# Patient Record
Sex: Female | Born: 1935 | Race: White | Hispanic: No | Smoking: Former smoker
Health system: Southern US, Community
[De-identification: ages and names within clinical notes are randomized; demographics above are authoritative.]

## PROBLEM LIST (undated history)

## (undated) DIAGNOSIS — I351 Nonrheumatic aortic (valve) insufficiency: Secondary | ICD-10-CM

## (undated) DIAGNOSIS — I509 Heart failure, unspecified: Secondary | ICD-10-CM

## (undated) DIAGNOSIS — I10 Essential (primary) hypertension: Secondary | ICD-10-CM

## (undated) DIAGNOSIS — I2510 Atherosclerotic heart disease of native coronary artery without angina pectoris: Secondary | ICD-10-CM

## (undated) DIAGNOSIS — J449 Chronic obstructive pulmonary disease, unspecified: Secondary | ICD-10-CM

## (undated) DIAGNOSIS — Z9889 Other specified postprocedural states: Secondary | ICD-10-CM

## (undated) DIAGNOSIS — E785 Hyperlipidemia, unspecified: Secondary | ICD-10-CM

## (undated) DIAGNOSIS — I340 Nonrheumatic mitral (valve) insufficiency: Secondary | ICD-10-CM

## (undated) HISTORY — PX: APPENDECTOMY: SHX54

## (undated) HISTORY — DX: Nonrheumatic aortic (valve) insufficiency: I35.1

## (undated) HISTORY — PX: TONSILLECTOMY: SUR1361

## (undated) HISTORY — DX: Chronic obstructive pulmonary disease, unspecified: J44.9

## (undated) HISTORY — DX: Nonrheumatic mitral (valve) insufficiency: I34.0

## (undated) HISTORY — DX: Heart failure, unspecified: I50.9

## (undated) HISTORY — DX: Essential (primary) hypertension: I10

## (undated) HISTORY — DX: Atherosclerotic heart disease of native coronary artery without angina pectoris: I25.10

## (undated) HISTORY — DX: Other specified postprocedural states: Z98.890

## (undated) HISTORY — DX: Hyperlipidemia, unspecified: E78.5

---

## 2003-04-13 DIAGNOSIS — I2510 Atherosclerotic heart disease of native coronary artery without angina pectoris: Secondary | ICD-10-CM

## 2003-04-13 HISTORY — DX: Atherosclerotic heart disease of native coronary artery without angina pectoris: I25.10

## 2003-04-13 HISTORY — PX: CORONARY ARTERY BYPASS GRAFT: SHX141

## 2003-09-20 ENCOUNTER — Other Ambulatory Visit: Payer: Self-pay

## 2005-10-19 ENCOUNTER — Ambulatory Visit: Payer: Self-pay | Admitting: Family Medicine

## 2005-10-21 ENCOUNTER — Ambulatory Visit: Payer: Self-pay | Admitting: General Surgery

## 2005-10-21 ENCOUNTER — Other Ambulatory Visit: Payer: Self-pay

## 2005-12-22 ENCOUNTER — Ambulatory Visit: Payer: Self-pay | Admitting: General Surgery

## 2005-12-29 ENCOUNTER — Ambulatory Visit: Payer: Self-pay | Admitting: General Surgery

## 2006-12-28 ENCOUNTER — Ambulatory Visit: Payer: Self-pay | Admitting: Family Medicine

## 2007-07-04 ENCOUNTER — Encounter: Payer: Self-pay | Admitting: Cardiovascular Disease

## 2008-02-29 ENCOUNTER — Encounter: Payer: Self-pay | Admitting: Cardiovascular Disease

## 2008-06-19 ENCOUNTER — Emergency Department: Payer: Self-pay | Admitting: Emergency Medicine

## 2009-03-17 ENCOUNTER — Emergency Department: Payer: Self-pay | Admitting: Emergency Medicine

## 2009-09-09 ENCOUNTER — Encounter: Payer: Self-pay | Admitting: Cardiovascular Disease

## 2009-09-11 ENCOUNTER — Ambulatory Visit: Payer: Self-pay | Admitting: Family Medicine

## 2009-09-15 ENCOUNTER — Encounter: Payer: Self-pay | Admitting: Cardiovascular Disease

## 2009-09-23 ENCOUNTER — Encounter: Payer: Self-pay | Admitting: Cardiovascular Disease

## 2010-04-12 HISTORY — PX: CAROTID ENDARTERECTOMY: SUR193

## 2010-04-27 ENCOUNTER — Inpatient Hospital Stay: Payer: Medicare Other | Admitting: Internal Medicine

## 2010-04-27 ENCOUNTER — Encounter: Payer: Self-pay | Admitting: Cardiovascular Disease

## 2010-04-28 ENCOUNTER — Encounter: Payer: Self-pay | Admitting: Cardiovascular Disease

## 2010-05-04 ENCOUNTER — Encounter: Payer: Self-pay | Admitting: Cardiovascular Disease

## 2010-05-21 ENCOUNTER — Encounter: Payer: Self-pay | Admitting: Cardiovascular Disease

## 2010-05-28 ENCOUNTER — Encounter: Payer: Self-pay | Admitting: Cardiovascular Disease

## 2010-05-28 ENCOUNTER — Ambulatory Visit (INDEPENDENT_AMBULATORY_CARE_PROVIDER_SITE_OTHER): Payer: Medicare Other | Admitting: Cardiovascular Disease

## 2010-05-28 DIAGNOSIS — E785 Hyperlipidemia, unspecified: Secondary | ICD-10-CM | POA: Insufficient documentation

## 2010-05-28 DIAGNOSIS — I2510 Atherosclerotic heart disease of native coronary artery without angina pectoris: Secondary | ICD-10-CM

## 2010-05-28 DIAGNOSIS — R0989 Other specified symptoms and signs involving the circulatory and respiratory systems: Secondary | ICD-10-CM | POA: Insufficient documentation

## 2010-05-28 DIAGNOSIS — I25810 Atherosclerosis of coronary artery bypass graft(s) without angina pectoris: Secondary | ICD-10-CM | POA: Insufficient documentation

## 2010-05-28 DIAGNOSIS — I519 Heart disease, unspecified: Secondary | ICD-10-CM | POA: Insufficient documentation

## 2010-05-28 DIAGNOSIS — I739 Peripheral vascular disease, unspecified: Secondary | ICD-10-CM

## 2010-05-28 DIAGNOSIS — I10 Essential (primary) hypertension: Secondary | ICD-10-CM

## 2010-06-03 NOTE — Assessment & Plan Note (Signed)
Summary: History of CABG and CHF/AMD   Visit Type:  Initial Consult Primary Provider:  Dr.Hedrick  CC:  Dr. Burnett Sheng referred. c/o SOB. denies chest palpitations. pt was in hospital for HTN and SOB. Marland Kitchen  History of Present Illness: [name] is a very pleasant 77 year old woman with a history of coronary artery disease, bypass surgery in 2005, moderate aortic valve regurgitation, mild to moderate MR, hyperlipidemia, hypertension, long smoking history for at least 40 years who presents for new patient evaluation.  She was recently admitted to the hospital for COPD exacerbation, felt also to have mild diastolic CHF with elevated BNP of 1600. She was treated with antibiotics and low-dose diuretic.  She states that she feels much better now with no significant shortness of breath. She is taking Advair on a regular basis. She stopped smoking in 2005. She denies any significant lower extremity edema, no cough. Her initial symptoms of COPD exacerbation started with a thick productive cough.  she is currently active, is able to exert herself to some degree with no limitations, balance is good  Last stress test was June 2011 showing no ischemia.  Cholesterol from 2009 shows total cholesterol 233, LDL 152  Echocardiogram June 2011 and shows moderate aortic valve insufficiency, mild to moderate MR with mitral valve prolapse, ejection fraction 55%  echo from January 2012 shows mild to moderate aortic valve insufficiency, mild MR, moderate MAC, moderately dilated left atrium  EKG shows normal sinus rhythm with a rate of 85 beats per minute, no significant ST or T wave changes  Preventive Screening-Counseling & Management  Alcohol-Tobacco     Smoking Status: never  Caffeine-Diet-Exercise     Does Patient Exercise: no      Drug Use:  no.    Problems Prior to Update: 1)  Carotid Bruits, Bilateral  (ICD-785.9)  Medications Prior to Update: 1)  None  Current Medications (verified): 1)  Paxil  20 Mg Tabs (Paroxetine Hcl) .Marland Kitchen.. 1 Tablet Once Daily 2)  Carvedilol 6.25 Mg Tabs (Carvedilol) .Marland Kitchen.. 1 Tablet Two Times A Day 3)  Aspir-Low 81 Mg Tbec (Aspirin) .Marland Kitchen.. 1 Tablet Once Daily 4)  Calcarb 600/d 600-400 Mg-Unit Tabs (Calcium Carbonate-Vitamin D) .Marland Kitchen.. 1 Tablet Once Daily 5)  Fish Oil 1000 Mg Caps (Omega-3 Fatty Acids) .Marland Kitchen.. 1 Tablet Once Daily 6)  Zantac 150 Mg Tabs (Ranitidine Hcl) .Marland Kitchen.. 1 Tablet Two Times A Day 7)  Alpresoline 10mg  .... 1 Tablet Four Times A Day 8)  Amitriptyline Hcl 25 Mg Tabs (Amitriptyline Hcl) .Marland Kitchen.. 1 Tablet At Bedtime 9)  Furosemide 20 Mg Tabs (Furosemide) .Marland Kitchen.. 1 Tablet Once Daily 10)  Advair Diskus 250-50 Mcg/dose Aepb (Fluticasone-Salmeterol) .Marland Kitchen.. 1 Puff Two Times A Day  Allergies (verified): 1)  ! Novocain 2)  ! * Tryline 3)  ! Codeine 4)  ! * Statins  Past History:  Family History: Last updated: 06-09-2010 Father: Emphysema-deceased Mother: unknown-deceased  Social History: Last updated: 2010/06/09 Retired  Single  Regular Exercise - no Drug Use - no Tobacco Use - No.  Alcohol Use - No   Risk Factors: Exercise: no (2010-06-09)  Risk Factors: Smoking Status: never (Jun 09, 2010)  Past Medical History: C O P D Hypertension Heart failure  Past Surgical History: CABG- [facility] Tonsillectomy Appendectomy  Family History: Father: Emphysema-deceased Mother: unknown-deceased  Social History: Retired  Single  Regular Exercise - no Drug Use - no Tobacco Use - No.  Alcohol Use - No  Does Patient Exercise:  no Drug Use:  no Smoking Status:  never  Review of Systems       The patient complains of dyspnea on exertion.  The patient denies fever, weight loss, weight gain, vision loss, decreased hearing, hoarseness, chest pain, syncope, peripheral edema, prolonged cough, abdominal pain, incontinence, muscle weakness, depression, and enlarged lymph nodes.    Vital Signs:  Patient profile:   77 year old female Height:      58.5  inches Weight:      82.12 pounds BMI:     16.93 Pulse rate:   85 / minute BP sitting:   136 / 81  (left arm) Cuff size:   regular  Vitals Entered By: Lysbeth Galas CMA (May 28, 2010 4:12 PM)   Physical Exam  General:  Well developed, well nourished, in no acute distress. Head:  normocephalic and atraumatic Neck:  Neck supple, no JVD. No masses, thyromegaly or abnormal cervical nodes. Lungs:  decreased breath sounds throughout bilaterally moderately Heart:  Non-displaced PMI, chest non-tender; regular rate and rhythm, S1, S2 with II/VI  diastolic murmur RSB, no rubs or gallops. Carotid upstroke normal, 2+ bruit on left, 1+ on right. Normal abdominal aortic size, no bruits.  Pedals normal pulses. No edema, no varicosities. Abdomen:  Bowel sounds positive; abdomen soft and non-tender without masses Msk:  Back normal, normal gait. Muscle strength and tone normal. Pulses:  pulses normal in all 4 extremities Extremities:  No clubbing or cyanosis. Neurologic:  Alert and oriented x 3. Skin:  Intact without lesions or rashes. Psych:  Normal affect.   Impression & Recommendations:  Problem # 1:  CAROTID BRUITS, BILATERAL (ICD-785.9) audible bruits bilaterally him a worse on the left. she reports that it has been years since her last carotid ultrasound. We have ordered a ultrasound to be done at her convenience.  Orders: Carotid Duplex (Carotid Duplex)  Problem # 2:  CAD, ARTERY BYPASS GRAFT (ICD-414.04) History of bypass in 2005. No intervention since that time. She did have a stress test June 2011 that showed no ischemia.  Her updated medication list for this problem includes:    Carvedilol 6.25 Mg Tabs (Carvedilol) .Marland Kitchen... 1 tablet two times a day    Aspir-low 81 Mg Tbec (Aspirin) .Marland Kitchen... 1 tablet once daily    Lisinopril 10 Mg Tabs (Lisinopril) .Marland Kitchen... Take one tablet by mouth daily  Problem # 3:  HYPERLIPIDEMIA-MIXED (ICD-272.4) she has hyperlipidemia and is not on a statin. She  does report myalgias with simvastatin. We will try her on Crestor 5 mg daily. Goal LDL is less than 70.  Her updated medication list for this problem includes:    Crestor 5 Mg Tabs (Rosuvastatin calcium) .Marland Kitchen... Take one tablet by mouth daily.  Problem # 4:  HYPERTENSION, BENIGN (ICD-401.1) Blood pressure is elevated. She brought in her blood pressure cuff with her today that does show systolic pressures in the 140-170 range. She has not picked up her higher dose hydralazine. We will add lisinopril 10 mg daily. We've asked her to continue to monitor her blood pressure  Her updated medication list for this problem includes:    Carvedilol 6.25 Mg Tabs (Carvedilol) .Marland Kitchen... 1 tablet two times a day    Aspir-low 81 Mg Tbec (Aspirin) .Marland Kitchen... 1 tablet once daily    Furosemide 20 Mg Tabs (Furosemide) .Marland Kitchen... 1 tablet once daily    Hydralazine Hcl 10 Mg Tabs (Hydralazine hcl) .Marland Kitchen... Take one tablet by mouth three times a day    Lisinopril 10 Mg Tabs (Lisinopril) .Marland Kitchen... Take one tablet by  mouth daily  Problem # 5:  DIASTOLIC DYSFUNCTION (ICD-429.9) She has elevated BNP, shortness of breath while in the hospital in January. We will continue the Lasix, start potassium 20 mg once daily as her potassium is low at 3.4 on recent blood work. She likely has some component of pulmonary hypertension causing her shortness of breath given her COPD. Given this, she will benefit from Lasix.  Other Orders: EKG w/ Interpretation (93000)  Patient Instructions: 1)  Your physician recommends that you schedule a follow-up appointment in: 3 months 2)  TO BE SCHEDULED IN 3 MONTHS:  Your physician recommends that you return for a FASTING lipid profile: (LIPID/LFT) 3)  Your physician has recommended you make the following change in your medication: START Lisinopril 10mg  once daily. START Crestor 5mg  once daily. START Potassium once daily. 4)  Your physician has requested that you have a carotid duplex. This test is an  ultrasound of the carotid arteries in your neck. It looks at blood flow through these arteries that supply the brain with blood. Allow one hour for this exam. There are no restrictions or special instructions. Prescriptions: CRESTOR 5 MG TABS (ROSUVASTATIN CALCIUM) Take one tablet by mouth daily.  #30 x 3   Entered by:   Lanny Hurst RN   Authorized by:   Dossie Arbour MD   Signed by:   Lanny Hurst RN on 05/28/2010   Method used:   Electronically to        [facility] [phone]* (retail)       [address] [place], [place]  [address]       Ph: [phone]       Fax: [phone]   RxID:   [id number] LISINOPRIL 10 MG TABS (LISINOPRIL) Take one tablet by mouth daily  #30 x 6   Entered by:   Lanny Hurst RN   Authorized by:   Dossie Arbour MD   Signed by:   Lanny Hurst RN on 05/28/2010   Method used:   Electronically to        [facility] [phone]* (retail)       [address] [place], [place]  [address]       Ph: [phone]       Fax: [phone]   RxID:   [id number] POTASSIUM CHLORIDE CRYS CR 20 MEQ CR-TABS (POTASSIUM CHLORIDE CRYS CR) Take one tablet by mouth daily  #30 x 6   Entered by:   Lanny Hurst RN   Authorized by:   Dossie Arbour MD   Signed by:   Lanny Hurst RN on 05/28/2010   Method used:   Electronically to        [facility] [phone]* (retail)       [address] [place], [place]  [address]       Ph: [phone]       Fax: [phone]   RxID:   [id number]

## 2010-06-09 NOTE — Letter (Signed)
Summary: Medical Record Release  Medical Record Release   Imported By: Harlon Flor 06/01/2010 13:20:32  _____________________________________________________________________  External Attachment:    Type:   Image     Comment:   External Document

## 2010-06-23 NOTE — Letter (Signed)
Summary: [facility] Recheck   Imported By: Roderic Ovens 06/18/2010 16:23:26  _____________________________________________________________________  External Attachment:    Type:   Image     Comment:   External Document

## 2010-06-23 NOTE — Letter (Signed)
Summary: [facility] Procedure  [facility]iology Procedure   Imported By: Roderic Ovens 06/18/2010 16:21:43  _____________________________________________________________________  External Attachment:    Type:   Image     Comment:   External Document

## 2010-06-23 NOTE — Letter (Signed)
Summary: [facility] Recheck  [facility] Recheck   Imported By: Roderic Ovens 06/18/2010 16:24:20  _____________________________________________________________________  External Attachment:    Type:   Image     Comment:   External Document

## 2010-06-23 NOTE — Letter (Signed)
SummaryScientist, [facility]   [facility]   Imported By: Roderic Ovens 06/16/2010 15:01:22  _____________________________________________________________________  External Attachment:    Type:   Image     Comment:   External Document

## 2010-07-20 ENCOUNTER — Ambulatory Visit: Payer: Medicare Other | Admitting: Vascular Surgery

## 2010-07-27 ENCOUNTER — Encounter: Payer: Self-pay | Admitting: Cardiovascular Disease

## 2010-07-27 ENCOUNTER — Telehealth: Payer: Self-pay | Admitting: Emergency Medicine

## 2010-07-27 NOTE — Telephone Encounter (Signed)
Pt is having a carotid endarterectomy. Tanisha from  vein and vascular wanted to know if patient was cleared for surgery.

## 2010-07-28 NOTE — Telephone Encounter (Signed)
Ok to have surgery. Acceptable risk. We need to check BP as it was high on last visit in 05/2010.

## 2010-07-29 ENCOUNTER — Encounter: Payer: Self-pay | Admitting: *Deleted

## 2010-07-29 ENCOUNTER — Ambulatory Visit (INDEPENDENT_AMBULATORY_CARE_PROVIDER_SITE_OTHER): Payer: Medicare Other | Admitting: *Deleted

## 2010-07-29 ENCOUNTER — Encounter: Payer: Self-pay | Admitting: Emergency Medicine

## 2010-07-29 DIAGNOSIS — I25810 Atherosclerosis of coronary artery bypass graft(s) without angina pectoris: Secondary | ICD-10-CM

## 2010-07-29 DIAGNOSIS — I10 Essential (primary) hypertension: Secondary | ICD-10-CM

## 2010-07-29 NOTE — Progress Notes (Signed)
Patient came in for BP check prior to having carotid surgery per Dr. Mariah Milling he was ok to have surgery. Acceptable risk.  We needed to check BP as it was high on last visit in 05/2010. BP today WNL patient with no complaints.  Patient will follow up as scheduled.

## 2010-07-29 NOTE — Telephone Encounter (Signed)
Called patient regarding BP check. Pt is to call me back after talking to her son since he is the one that will be driving her.

## 2010-07-29 NOTE — Telephone Encounter (Signed)
Pt is coming in for BP check today @ 11/sab

## 2010-08-19 ENCOUNTER — Ambulatory Visit: Payer: Medicare Other | Admitting: Vascular Surgery

## 2010-08-26 ENCOUNTER — Inpatient Hospital Stay: Payer: Medicare Other | Admitting: Vascular Surgery

## 2010-10-15 ENCOUNTER — Inpatient Hospital Stay: Payer: Medicare Other | Admitting: Internal Medicine

## 2010-10-19 HISTORY — PX: CARDIAC CATHETERIZATION: SHX172

## 2010-10-22 ENCOUNTER — Telehealth: Payer: Self-pay | Admitting: Cardiovascular Disease

## 2010-10-22 MED ORDER — ROSUVASTATIN CALCIUM 5 MG PO TABS: 5.0000 mg | ORAL_TABLET | Freq: Every day | ORAL | Status: AC

## 2010-10-22 MED ORDER — POTASSIUM CHLORIDE CRYS ER 20 MEQ PO TBCR: 20.0000 meq | EXTENDED_RELEASE_TABLET | Freq: Every day | ORAL | Status: AC

## 2010-10-22 MED ORDER — FUROSEMIDE 20 MG PO TABS: 20.0000 mg | ORAL_TABLET | Freq: Every day | ORAL | Status: AC

## 2010-10-22 MED ORDER — LISINOPRIL 10 MG PO TABS: 10.0000 mg | ORAL_TABLET | Freq: Every day | ORAL | Status: DC

## 2010-10-22 MED ORDER — CARVEDILOL 6.25 MG PO TABS: 6.2500 mg | ORAL_TABLET | Freq: Two times a day (BID) | ORAL | Status: DC

## 2010-10-22 NOTE — Telephone Encounter (Signed)
Please call the scripts into the [facility] on [address] and call the patient once this has been completed.

## 2010-10-22 NOTE — Telephone Encounter (Signed)
Would like all prescriptions to be switched to [facility] and would like any generic versions that are available.

## 2011-04-13 DIAGNOSIS — Z9889 Other specified postprocedural states: Secondary | ICD-10-CM

## 2011-04-13 HISTORY — DX: Other specified postprocedural states: Z98.890

## 2011-08-31 ENCOUNTER — Other Ambulatory Visit: Payer: Self-pay | Admitting: Cardiovascular Disease

## 2011-09-20 ENCOUNTER — Encounter (INDEPENDENT_AMBULATORY_CARE_PROVIDER_SITE_OTHER): Payer: Medicare Other | Admitting: Cardiovascular Disease

## 2011-09-20 ENCOUNTER — Encounter: Payer: Self-pay | Admitting: Cardiovascular Disease

## 2011-09-20 VITALS — BP 100/50 | HR 72 | Ht 58.5 in | Wt 82.8 lb

## 2011-09-20 DIAGNOSIS — I25810 Atherosclerosis of coronary artery bypass graft(s) without angina pectoris: Secondary | ICD-10-CM

## 2011-09-27 ENCOUNTER — Ambulatory Visit (INDEPENDENT_AMBULATORY_CARE_PROVIDER_SITE_OTHER): Payer: Medicare Other | Admitting: Cardiovascular Disease

## 2011-09-27 ENCOUNTER — Encounter: Payer: Self-pay | Admitting: Cardiovascular Disease

## 2011-09-27 VITALS — BP 98/48 | HR 64 | Ht 58.5 in | Wt 83.0 lb

## 2011-09-27 DIAGNOSIS — I10 Essential (primary) hypertension: Secondary | ICD-10-CM

## 2011-09-27 DIAGNOSIS — I519 Heart disease, unspecified: Secondary | ICD-10-CM

## 2011-09-27 DIAGNOSIS — R0989 Other specified symptoms and signs involving the circulatory and respiratory systems: Secondary | ICD-10-CM

## 2011-09-27 DIAGNOSIS — I25810 Atherosclerosis of coronary artery bypass graft(s) without angina pectoris: Secondary | ICD-10-CM

## 2011-09-27 DIAGNOSIS — E785 Hyperlipidemia, unspecified: Secondary | ICD-10-CM

## 2011-09-27 NOTE — Assessment & Plan Note (Signed)
Previous diagnosis of diastolic dysfunction. She was started on Lasix daily over the past several months. Blood pressure is now low, periods of orthostasis. We have suggested she take Lasix every other day. He could probably take Lasix as needed.

## 2011-09-27 NOTE — Assessment & Plan Note (Signed)
Cholesterol October 2012 was at goal. Continue Crestor 5 mg daily.

## 2011-09-27 NOTE — Assessment & Plan Note (Signed)
Currently with no symptoms of angina. No further workup at this time. Continue current medication regimen. 

## 2011-09-27 NOTE — Patient Instructions (Addendum)
You are doing well. Stop the hydralazine  Take lasix/furosemide every other day ( potassium also every other day) If you have some shortness of breath or edema, take an extra lasix/furosemide   Please call us if you have new issues that need to be addressed before your next appt.  Your physician wants you to follow-up in: 6 months.  You will receive a reminder letter in the mail two months in advance. If you don't receive a letter, please call our office to schedule the follow-up appointment.

## 2011-09-27 NOTE — Assessment & Plan Note (Signed)
We'll continue aggressive cholesterol management. She stopped smoking 8 years ago.

## 2011-09-27 NOTE — Progress Notes (Signed)
Patient ID: Carla Mullins, female    DOB: 1926-12-15, 78 y.o.   MRN: [id number]  HPI Comments: Carla Mullins is a very pleasant 78 year old woman with a history of coronary artery disease, bypass surgery in 2005, moderate aortic valve regurgitation, mild to moderate MR, hyperlipidemia, hypertension, long smoking history for at least 40 years, lower extremity peripheral vascular disease seen by Dr. Lorretta Harp,  who presents for routine followup. She stopped smoking in 2005   She was previously admitted to the hospital for COPD exacerbation, felt also to have mild diastolic CHF with elevated BNP of 1600. She was treated with antibiotics and low-dose diuretic.  Overall she feels well. Mild baseline shortness of breath. No significant edema. Possible mild lower extremity claudication type symptoms. She is scheduled to see Dr. Lorretta Harp for possible lower extremity angiogram. She reports right lower extremity disease is worse than the left. She does report previous orthostatic type symptoms but this has improved. Blood pressure typically runs low.  she is currently active, is able to exert herself to some degree with no limitations, balance is good   Last stress test was June 2011 showing no ischemia. Cholesterol from the hospital October 2012 is 135, LDL 58, HDL 63 on Crestor 5 mg daily. Echocardiogram June 2011 and shows moderate aortic valve insufficiency, mild to moderate MR with mitral valve prolapse, ejection fraction 55% echo from January 2012 shows mild to moderate aortic valve insufficiency, mild MR, moderate MAC, moderately dilated left atrium   EKG from 09/20/2011 shows normal sinus rhythm with a rate of 72 beats per minute, no significant ST or T wave changes    Outpatient Encounter Prescriptions as of 09/27/2011  Medication Sig Dispense Refill  . Ascorbic Acid (VITAMIN C) 1000 MG tablet Take 1,000 mg by mouth daily.      Marland Kitchen aspirin 81 MG tablet Take 81 mg by mouth daily.        . Calcium  Carbonate-Vitamin D (CALCARB 600/D) 600-400 MG-UNIT per tablet Take 1 tablet by mouth daily.        . carvedilol (COREG) 12.5 MG tablet Take 12.5 mg by mouth 2 (two) times daily with a meal.      . Cholecalciferol (VITAMIN D3) 1000 UNITS CAPS Take 1,000 Units by mouth daily.      . Cholecalciferol (VITAMIN D3) 1000 UNITS CAPS Take 1,000 Units by mouth daily.      . Fluticasone-Salmeterol (ADVAIR DISKUS) 250-50 MCG/DOSE AEPB Inhale 1 puff into the lungs every 12 (twelve) hours.        . furosemide (LASIX) 20 MG tablet Take 1 tablet (20 mg total) by mouth daily.  30 tablet  8  . hydrALAZINE (APRESOLINE) 10 MG tablet Take 10 mg by mouth 4 (four) times daily.      Marland Kitchen levalbuterol (XOPENEX HFA) 45 MCG/ACT inhaler Inhale 1-2 puffs into the lungs every 4 (four) hours as needed.      Marland Kitchen lisinopril (PRINIVIL,ZESTRIL) 10 MG tablet TAKE ONE TABLET BY MOUTH EVERY DAY  30 tablet  6  . Omega-3 Fatty Acids (FISH OIL) 1200 MG CAPS Take 1,200 mg by mouth daily.      Marland Kitchen PARoxetine (PAXIL) 20 MG tablet Take 20 mg by mouth every morning.        . potassium chloride SA (K-DUR,KLOR-CON) 20 MEQ tablet Take 1 tablet (20 mEq total) by mouth daily.  30 tablet  8  . rosuvastatin (CRESTOR) 5 MG tablet Take 1 tablet (5 mg total) by mouth daily.  30 tablet  8  . tiotropium (SPIRIVA) 18 MCG inhalation capsule Place 18 mcg into inhaler and inhale daily.        Review of Systems  Constitutional: Negative.   HENT: Negative.   Eyes: Negative.   Respiratory: Negative.   Cardiovascular: Negative.   Gastrointestinal: Negative.   Musculoskeletal: Negative.   Skin: Negative.   Neurological: Negative.   Hematological: Negative.   Psychiatric/Behavioral: Negative.   All other systems reviewed and are negative.     BP 98/48  Pulse 64  Ht 4' 10.5" (1.486 m)  Wt 83 lb (37.649 kg)  BMI 17.05 kg/m2  Physical Exam  Nursing note and vitals reviewed. Constitutional: She is oriented to person, place, and time. She appears  well-developed and well-nourished.  HENT:  Head: Normocephalic.  Nose: Nose normal.  Mouth/Throat: Oropharynx is clear and moist.  Eyes: Conjunctivae are normal. Pupils are equal, round, and reactive to light.  Neck: Normal range of motion. Neck supple. No JVD present.  Cardiovascular: Normal rate, regular rhythm, S1 normal, S2 normal, normal heart sounds and intact distal pulses.  Exam reveals no gallop and no friction rub.   No murmur heard. Pulmonary/Chest: Effort normal and breath sounds normal. No respiratory distress. She has no wheezes. She has no rales. She exhibits no tenderness.  Abdominal: Soft. Bowel sounds are normal. She exhibits no distension. There is no tenderness.  Musculoskeletal: Normal range of motion. She exhibits no edema and no tenderness.  Lymphadenopathy:    She has no cervical adenopathy.  Neurological: She is alert and oriented to person, place, and time. Coordination normal.  Skin: Skin is warm and dry. No rash noted. No erythema.  Psychiatric: She has a normal mood and affect. Her behavior is normal. Judgment and thought content normal.         Assessment and Plan       Date of

## 2011-09-27 NOTE — Assessment & Plan Note (Signed)
Blood pressure is running low. We will hold hydralazine in light of her orthostasis. Also suggested she take Lasix every other day, potassium every other day.

## 2011-09-29 ENCOUNTER — Inpatient Hospital Stay: Payer: Self-pay | Admitting: Vascular Surgery

## 2011-09-29 LAB — CBC WITH DIFFERENTIAL/PLATELET
Basophil #: 0 10*3/uL (ref 0.0–0.1)
Basophil %: 0.6 %
Eosinophil #: 0.4 10*3/uL (ref 0.0–0.7)
HGB: 6.7 g/dL — ABNORMAL LOW (ref 12.0–16.0)
Lymphocyte #: 1.5 10*3/uL (ref 1.0–3.6)
Lymphocyte %: 33.1 %
MCV: 93 fL (ref 80–100)
Monocyte %: 6.6 %
Neutrophil #: 2.3 10*3/uL (ref 1.4–6.5)
Neutrophil %: 51.6 %
RBC: 2.18 10*6/uL — ABNORMAL LOW (ref 3.80–5.20)
RDW: 12.5 % (ref 11.5–14.5)

## 2011-09-29 LAB — CREATININE, SERUM: EGFR (African American): 46 — ABNORMAL LOW

## 2011-09-30 DIAGNOSIS — I2510 Atherosclerotic heart disease of native coronary artery without angina pectoris: Secondary | ICD-10-CM

## 2011-09-30 LAB — CBC WITH DIFFERENTIAL/PLATELET
Basophil #: 0 10*3/uL (ref 0.0–0.1)
Eosinophil #: 0 10*3/uL (ref 0.0–0.7)
Eosinophil %: 0 %
HGB: 9.4 g/dL — ABNORMAL LOW (ref 12.0–16.0)
Lymphocyte %: 8 %
MCH: 30.3 pg (ref 26.0–34.0)
MCHC: 33.6 g/dL (ref 32.0–36.0)
Monocyte #: 1.2 x10 3/mm — ABNORMAL HIGH (ref 0.2–0.9)
Monocyte %: 8.2 %
Neutrophil #: 12.6 10*3/uL — ABNORMAL HIGH (ref 1.4–6.5)
RDW: 14.1 % (ref 11.5–14.5)
WBC: 15.1 10*3/uL — ABNORMAL HIGH (ref 3.6–11.0)

## 2011-09-30 LAB — BASIC METABOLIC PANEL
BUN: 27 mg/dL — ABNORMAL HIGH (ref 7–18)
Calcium, Total: 6.7 mg/dL — CL (ref 8.5–10.1)
Chloride: 114 mmol/L — ABNORMAL HIGH (ref 98–107)
Creatinine: 1.76 mg/dL — ABNORMAL HIGH (ref 0.60–1.30)
EGFR (Non-African Amer.): 26 — ABNORMAL LOW
Glucose: 206 mg/dL — ABNORMAL HIGH (ref 65–99)
Sodium: 146 mmol/L — ABNORMAL HIGH (ref 136–145)

## 2011-09-30 LAB — TROPONIN I: Troponin-I: 0.02 ng/mL

## 2011-09-30 LAB — HEMOGLOBIN: HGB: 8.7 g/dL — ABNORMAL LOW (ref 12.0–16.0)

## 2011-10-01 LAB — CBC WITH DIFFERENTIAL/PLATELET
Basophil #: 0 10*3/uL (ref 0.0–0.1)
HCT: 34.2 % — ABNORMAL LOW (ref 35.0–47.0)
HGB: 11.6 g/dL — ABNORMAL LOW (ref 12.0–16.0)
Lymphocyte #: 1.3 10*3/uL (ref 1.0–3.6)
Lymphocyte %: 7.8 %
Monocyte %: 11.7 %
Neutrophil #: 13.7 10*3/uL — ABNORMAL HIGH (ref 1.4–6.5)
Neutrophil %: 80.4 %
Platelet: 76 10*3/uL — ABNORMAL LOW (ref 150–440)
RDW: 14.1 % (ref 11.5–14.5)
WBC: 17 10*3/uL — ABNORMAL HIGH (ref 3.6–11.0)

## 2011-10-01 LAB — BASIC METABOLIC PANEL
Co2: 21 mmol/L (ref 21–32)
Creatinine: 1.93 mg/dL — ABNORMAL HIGH (ref 0.60–1.30)
EGFR (African American): 27 — ABNORMAL LOW
Osmolality: 295 (ref 275–301)
Potassium: 4.5 mmol/L (ref 3.5–5.1)

## 2011-10-01 LAB — TROPONIN I
Troponin-I: 0.29 ng/mL — ABNORMAL HIGH
Troponin-I: 2.22 ng/mL — ABNORMAL HIGH
Troponin-I: 9.46 ng/mL — ABNORMAL HIGH

## 2011-10-01 LAB — GASTROCCULT (ARMC): Occult Blood, Gastric: POSITIVE

## 2011-10-02 LAB — BASIC METABOLIC PANEL
Chloride: 113 mmol/L — ABNORMAL HIGH (ref 98–107)
Creatinine: 1.37 mg/dL — ABNORMAL HIGH (ref 0.60–1.30)
EGFR (African American): 41 — ABNORMAL LOW
Glucose: 126 mg/dL — ABNORMAL HIGH (ref 65–99)
Osmolality: 287 (ref 275–301)
Potassium: 4.6 mmol/L (ref 3.5–5.1)
Sodium: 142 mmol/L (ref 136–145)

## 2011-10-02 LAB — CBC WITH DIFFERENTIAL/PLATELET
Eosinophil #: 0 10*3/uL (ref 0.0–0.7)
Eosinophil %: 0.4 %
HCT: 24.9 % — ABNORMAL LOW (ref 35.0–47.0)
HGB: 8.7 g/dL — ABNORMAL LOW (ref 12.0–16.0)
Lymphocyte #: 1 10*3/uL (ref 1.0–3.6)
MCH: 31.1 pg (ref 26.0–34.0)
MCHC: 35 g/dL (ref 32.0–36.0)
Monocyte #: 1.1 x10 3/mm — ABNORMAL HIGH (ref 0.2–0.9)
Monocyte %: 13 %
Neutrophil #: 6.6 10*3/uL — ABNORMAL HIGH (ref 1.4–6.5)
Neutrophil %: 74.9 %
Platelet: 63 10*3/uL — ABNORMAL LOW (ref 150–440)
RBC: 2.81 10*6/uL — ABNORMAL LOW (ref 3.80–5.20)
WBC: 8.9 10*3/uL (ref 3.6–11.0)

## 2011-10-02 LAB — TROPONIN I: Troponin-I: 7.47 ng/mL — ABNORMAL HIGH

## 2011-10-02 LAB — HEMATOCRIT: HCT: 25.7 % — ABNORMAL LOW (ref 35.0–47.0)

## 2011-10-11 DEATH — deceased

## 2013-03-02 IMAGING — CT CT ANGIOGRAPHY NECK
1 of 4 series · 12 of 33 positions shown · IV contrast (APPLIED)
Comparison: none

REASON FOR EXAM: carotid stenosis
COMMENTS:

PROCEDURE:     KCT - KCT ANGIOGRAPHY NECK W/WO  - July 20, 2010  [DATE]
RESULT:
HISTORY: Carotid stenosis.
Comparison Study: No recent.

[Series 4: 3 soft tissue · axial · 0.40mm/px · z∈[+454,+685]mm · 12 of 91 slices shown]
[im 7/91  soft-tissue]
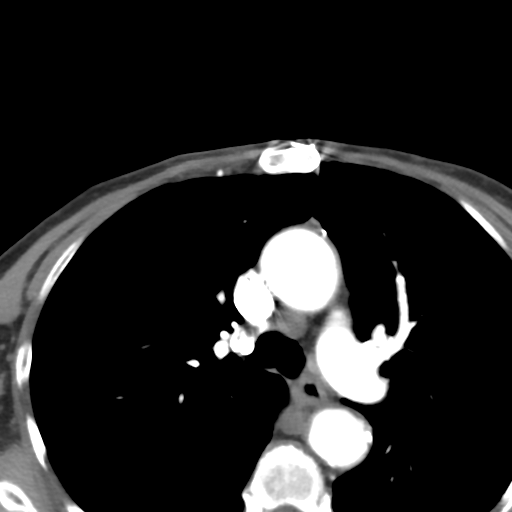
[im 14/91  bone]
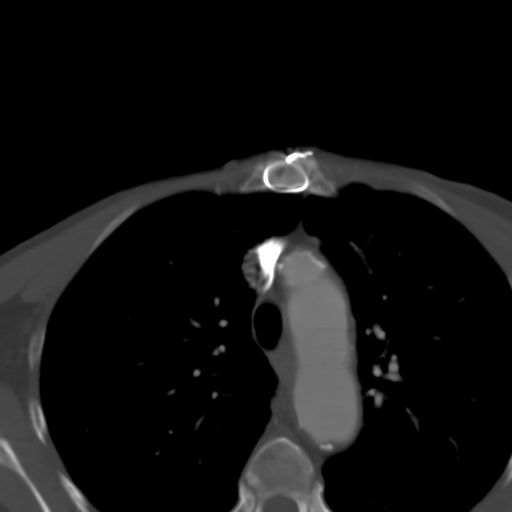
[im 21/91  soft-tissue]
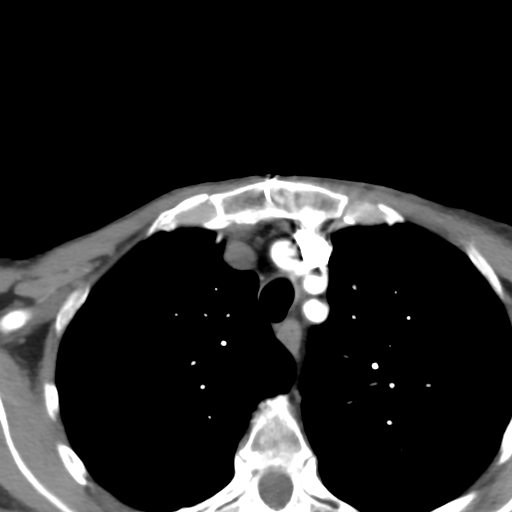
[im 28/91  bone]
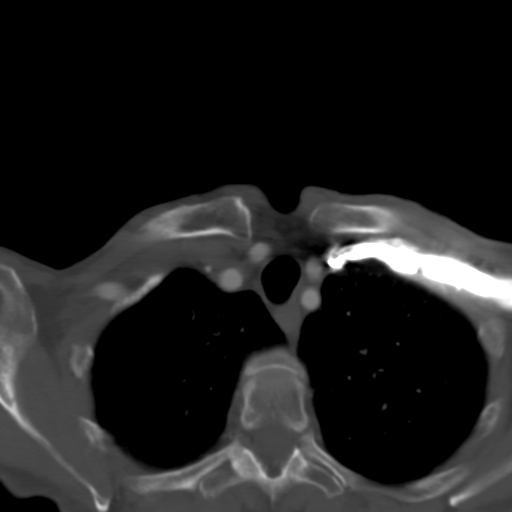
[im 35/91  soft-tissue]
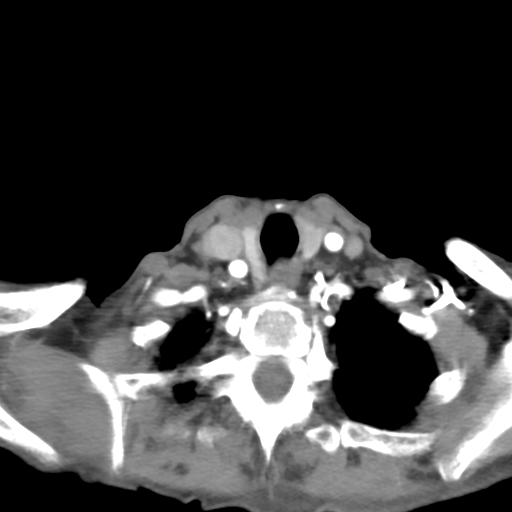
[im 42/91  bone]
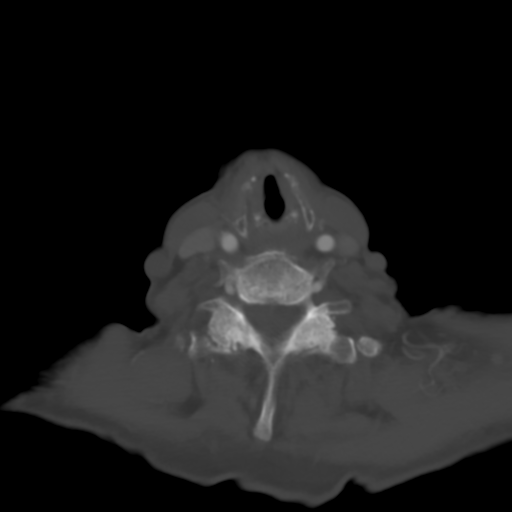
[im 49/91  soft-tissue]
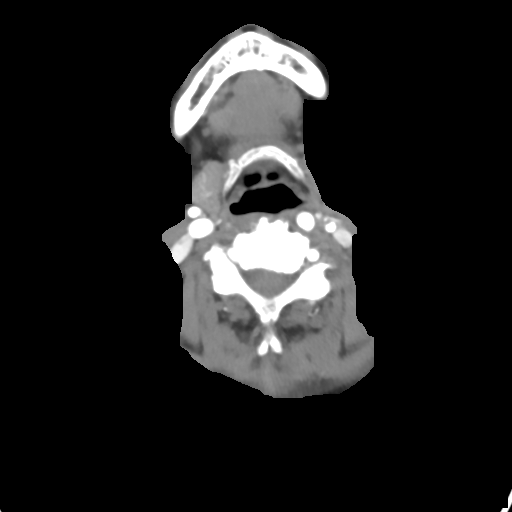
[im 56/91  bone]
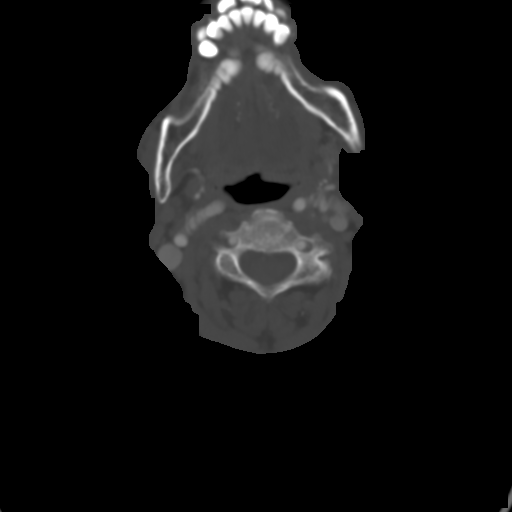
[im 63/91  soft-tissue]
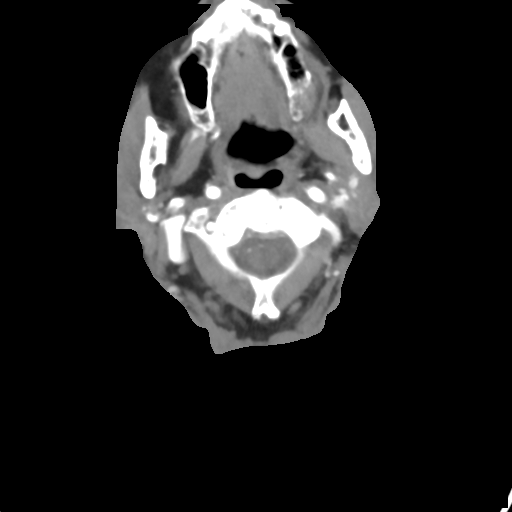
[im 70/91  bone]
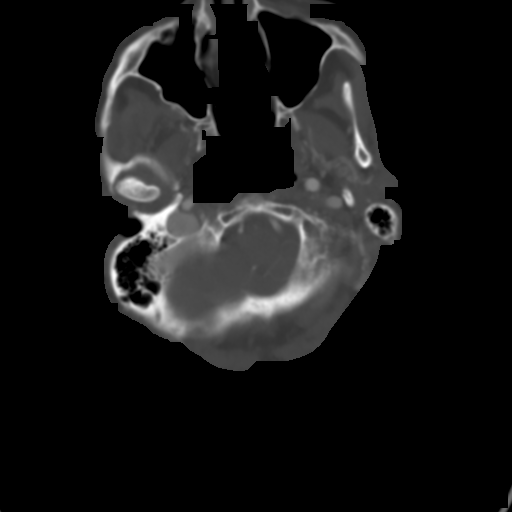
[im 77/91  soft-tissue]
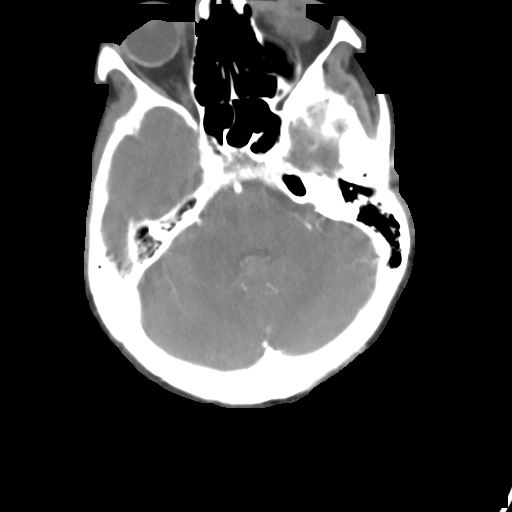
[im 84/91  bone]
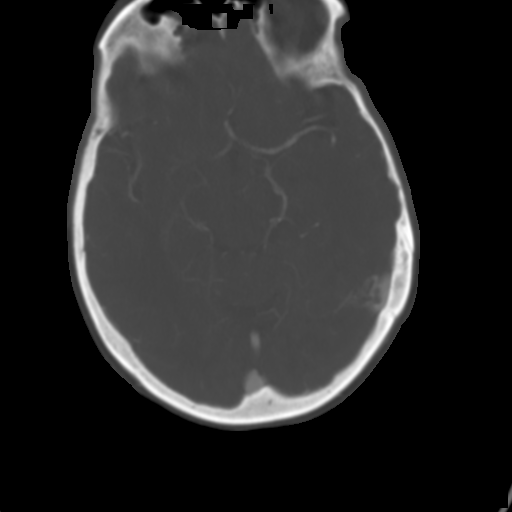

[12 of 33 positions shown; findings below may reference images not displayed]

FINDINGS: Standard CT angiography was performed with 100 ml of [id number].
Evaluation of axial images, MIP images and three dimensional images on
vessel-view were performed. Evaluation with vessel-view was performed on a
separate workstation. Intracranially, the anterior and posterior circulation
is widely patent. Standard great vessel anatomy is noted. Mild stenosis in
noted in the origin of the right subclavian. Mild calcific stenosis noted of
the origin of the right vertebral. Origin moderate stenosis is noted of the
left subclavian. This is calcified. Calcified origin stenosis noted of the
left vertebral. Both vertebral arteries are patent. Basilar artery is patent.

On the left, there is approximately 75% stenosis of the left carotid
bifurcation. This is calcific.

On the right, there is a 50% calcific stenosis of the right carotid
bifurcation.
IMPRESSION: 1. 75% left carotid bifurcation calcific stenosis.
2. 50% calcific stenosis right carotid bifurcation.
3. Mild to moderate origin stenosis of the left subclavian origin. Mild
stenosis is noted of origins of both vertebrals. Not mentioned above is
approximately 50% stenosis of the posterior vertebral right subclavian
artery.

## 2013-07-03 NOTE — Progress Notes (Signed)
This encounter was created in error - please disregard.

## 2014-08-04 NOTE — Discharge Summary (Signed)
PATIENT NAME:  Carla Mullins, Carla Mullins MR#:  [id number] DATE OF BIRTH:  27-Nov-1926  DATE OF ADMISSION:  09/29/2011 DATE OF DISCHARGE:  10/03/2011  DISCHARGE DIAGNOSES:  1. 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  2. Myocardial infarction.  3. Anemia.  4. Complication of procedure with hematoma of the groin.   CONSULTANTS: Julien Nordmannimothy Gollan, MD - Cardiology.   PROCEDURE PERFORMED: Angiography with intervention on the day of admission.   SECONDARY DIAGNOSES:  1. Coronary artery disease.  2. History of coronary artery bypass graft. 3. History of carotid artery stenosis status post carotid endarterectomy.  4. Severe chronic obstructive pulmonary disease.  5. Panic attacks.  6. Aortic valve disorder.  7. Psoriasis.   HISTORY: [name] is an 81 year old woman who presented to the hospital with increasing pain of her bilateral lower extremities. She is here for revascularization.   HOSPITAL COURSE: On the day of admission, the patient underwent successful percutaneous intervention with angioplasty and stent placement in the right common iliac artery as well as the left common iliac artery and stent placement in the left external iliac artery. She had stand-alone angioplasty of the right external iliac. Postoperatively she did develop a hematoma on the right side in spite of a Mynx closure device. She did briefly drop her pressure and was resuscitated with IV fluids. However, given her hemoglobin, which was returned as 6.5, it was elected that she should stay. In discussion with the son regarding transfusion, she did receive several units of blood, 2 units on 09/29/2011 and 2 units on 10/01/2011. She was also noted to have a bump in enzymes. Cardiology was consulted. She did not undergo catheterization at this time. By postprocedure day number four, she was tolerating a regular diet. She was up and ambulating with minimal assistance. She denied any pain. The right  groin was quite stable and pain appeared to be minimal and her hemoglobin was also stable. She was therefore felt ready for discharge. She is discharged to home. She will continue her medications as already placed in the computer. She will follow up with Dr. Mariah MillingGollan as arranged. She will followup with me in 1 to 2 weeks. Activities are light activities, walking is okay, and no heavy lifting until she is seen back in the office. Diet is a healthy heart diet. Her condition on discharge is improved.  ____________________________ Renford DillsGregory G. Schnier, MD ggs:slb D: 10/19/2011 12:18:43 ET T: 10/19/2011 12:45:35 ET JOB#: [id number]  cc: Renford DillsGregory G. Schnier, MD, <Dictator> [name]. Gollan, MD Renford DillsGREGORY G SCHNIER MD ELECTRONICALLY SIGNED 11/02/2011 12:57

## 2014-08-04 NOTE — Consult Note (Signed)
General Aspect Carla Mullins is a very pleasant 81 year old woman recently seen in the [facility] Cardiology clinic, with a history of CAD, bypass surgery in 2005, moderate aortic valve regurgitation, mild to moderate MR, hyperlipidemia, hypertension, long smoking history for at least 40 years, lower extremity peripheral vascular disease,  stopped smoking in 2005, status post bilateral iliac stenting of internal and external iliac vessels, postoperative blood loss, hypotension requiring blood transfusion. Cardiology was consulted for elevated cardiac enzymes.  Notes indicate period of hypotension postoperatively when closure device was attempted requiring liter of fluids and atropine. Hematocrit decreased to 20. Notes indicate yesterday she had periods of hypotension with systolic pressures down to 80. She had periods of nausea and vomiting the afternoon. She received blood transfusion the afternoon with improvement of her symptoms overnight. This a.m., she reports feeling well with no complaints. Nausea and vomiting has resolved though she is having some "gas". She reports having a bowel movement this morning. She has not had much breakfast and is on normal saline at 80 cc per hour. She denies any significant chest pain through yesterday or this morning.     previously admitted to the hospital for COPD exacerbation, felt also to have mild diastolic CHF with elevated BNP of 1600. She was treated with antibiotics and low-dose diuretic.  Cardiac catheterization in 2012 showed atretic LIMA, occluded vein graft, severe left main disease and three-vessel coronary artery disease. Medical management was recommended. Cholesterol from the hospital October 2012 is 135, LDL 58, HDL 63 on Crestor 5 mg daily. Echocardiogram June 2011 and shows moderate aortic valve insufficiency, mild to moderate MR with mitral valve prolapse, ejection fraction 55% echo from January 2012 shows mild to moderate aortic valve insufficiency,  mild MR, moderate MAC, moderately dilated left atrium    Present Illness . FAMILY HISTORY: Positive for hypertension, coronary artery disease, dyslipidemia.   SOCIAL HISTORY: Patient quit smoking more than 10 to 15 years ago. Denies any drug or alcohol abuse. Lives alone. Ambulates independently.   Physical Exam:   GEN well developed, well nourished, no acute distress, thin    HEENT pink conjunctivae    NECK supple    CARD Regular rate and rhythm  Murmur    ABD denies tenderness  soft    LYMPH negative neck    NEURO cranial nerves intact, motor/sensory function intact    PSYCH alert, A+O to time, place, person, good insight   Review of Systems:   Subjective/Chief Complaint Nausea vomiting, malaise, now resolved    General: Weakness    Skin: No Complaints    ENT: No Complaints    Eyes: No Complaints    Neck: No Complaints    Respiratory: No Complaints    Cardiovascular: No Complaints    Gastrointestinal: No Complaints    Genitourinary: No Complaints    Vascular: No Complaints    Musculoskeletal: No Complaints    Neurologic: No Complaints    Hematologic: No Complaints    Endocrine: No Complaints    Psychiatric: No Complaints    Review of Systems: All other systems were reviewed and found to be negative    Medications/Allergies Reviewed Medications/Allergies reviewed     Blood product transfusion:    pvd:    Aortic valve Disorder:    Panic Attacks:    CAD:    Hypertension:    Severe COPD:    psoriasis:    Carotid Endarterectomy:    Tonsillectomy:    CABG (Coronary Artery Bypass Graft): 2005  hernia repair:    appendectomy:        Admit Diagnosis:   SP ANGIO ASO WITH CLAUDICAITON RT LEG.: 01-Oct-2011, Active, SP ANGIO ASO WITH CLAUDICAITON RT LEG.      Admit Reason:   Hematoma complicating a procedure: (998.12) Active, ICD9, Hematoma complicating a procedure  Home Medications: Medication Instructions Status  Advair Diskus  250 mcg-50 mcg inhalation powder 1 puff(s) inhaled 2 times a day  Active  lisinopril 10 mg oral tablet 1 tab(s) orally once a day  Active  paroxetine 20 mg oral tablet 1 tab(s) orally once a day  Active  potassium chloride 20 mEq oral tablet, extended release 1 tab(s) orally once a day  Active  Spiriva 18 mcg inhalation capsule 1 cap(s) via handihaler inhaled once a day  Active  Xopenex HFA 45 mcg/inh inhalation aerosol 2 puff(s) inhaled 4 times a day as needed. Active  Coreg 12.5 mg oral tablet 1 tab(s) orally 2 times a day Active  Vitamin C 1000 milligram(s) orally once a day Active  aspirin 81 mg oral tablet 1 tab(s) orally once a day Active  Calcarb with D 600 mg-400 intl units oral tablet 1 tab(s) orally once a day Active  Vitamin D3 1000 unit(s) orally once a day Active  furosemide 20 mg oral tablet 1 tab(s) orally once a day Active  hydrALAZINE 10 mg oral tablet 1 tab(s) orally 4 times a day Active  Fish Oil 1200 milligram(s) orally once a day Active  Crestor 10 mg oral tablet 0.5 tab(s) orally once a day (at bedtime) Active   Lab Results:  Routine Chem:  20-Jun-13 07:20    Result Comment CALCIUM - RESULTS VERIFIED BY REPEAT TESTING.  - NOTIFIED OF CRITICAL VALUE  - RESULT CALLED TO LOLITA OWENS AT 8264  - 09/30/11-DAC  - READ-BACK PROCESS PERFORMED.  Result(s) reported on 30 Sep 2011 at 08:12AM.   Glucose, Serum  206   BUN  27   Creatinine (comp)  1.76   Sodium, Serum  146   Potassium, Serum 4.5   Chloride, Serum  114   CO2, Serum 22   Calcium (Total), Serum  6.7   Anion Gap 10   Osmolality (calc) 302   eGFR (African American)  30   eGFR (Non-African American)  26 (eGFR values <21m/min/1.73 m2 may be an indication of chronic kidney disease (CKD). Calculated eGFR is useful in patients with stable renal function. The eGFR calculation will not be reliable in acutely ill patients when serum creatinine is changing rapidly. It is not useful in  patients on dialysis. The  eGFR calculation may not be applicable to patients at the low and high extremes of body sizes, pregnant women, and vegetarians.)  21-Jun-13 05:51    Result Comment TROPONIN - RESULTS VERIFIED BY REPEAT TESTING.  - RESULT PREV C @0047  ON 10/01/11  - HP  Result(s) reported on 01 Oct 2011 at 06:50AM.   Glucose, Serum  127   BUN  30   Creatinine (comp)  1.93   Sodium, Serum 144   Potassium, Serum 4.5   Chloride, Serum  111   CO2, Serum 21   Calcium (Total), Serum  7.5   Anion Gap 12   Osmolality (calc) 295   eGFR (African American)  27   eGFR (Non-African American)  24 (eGFR values <618mmin/1.73 m2 may be an indication of chronic kidney disease (CKD). Calculated eGFR is useful in patients with stable renal function. The eGFR calculation will not be  reliable in acutely ill patients when serum creatinine is changing rapidly. It is not useful in  patients on dialysis. The eGFR calculation may not be applicable to patients at the low and high extremes of body sizes, pregnant women, and vegetarians.)  Cardiac:  20-Jun-13 07:20    Troponin I < 0.02 (0.00-0.05 0.05 ng/mL or less: NEGATIVE  Repeat testing in 3-6 hrs  if clinically indicated. >0.05 ng/mL: POTENTIAL  MYOCARDIAL INJURY. Repeat  testing in 3-6 hrs if  clinically indicated. NOTE: An increase or decrease  of 30% or more on serial  testing suggests a  clinically important change)  21-Jun-13 05:51    Troponin I  2.22 (0.00-0.05 0.05 ng/mL or less: NEGATIVE  Repeat testing in 3-6 hrs  if clinically indicated. >0.05 ng/mL: POTENTIAL  MYOCARDIAL INJURY. Repeat  testing in 3-6 hrs if  clinically indicated. NOTE: An increase or decrease  of 30% or more on serial  testing suggests a  clinically important change)  Routine Hem:  20-Jun-13 07:20    WBC (CBC)  15.1   RBC (CBC)  3.09   Hemoglobin (CBC)  9.4   Hematocrit (CBC)  27.9   Platelet Count (CBC)  93   MCV 90   MCH 30.3   MCHC 33.6   RDW 14.1   Neutrophil %  83.7   Lymphocyte % 8.0   Monocyte % 8.2   Eosinophil % 0.0   Basophil % 0.1   Neutrophil #  12.6   Lymphocyte # 1.2   Monocyte #  1.2   Eosinophil # 0.0   Basophil # 0.0 (Result(s) reported on 30 Sep 2011 at 08:12AM.)  21-Jun-13 05:51    WBC (CBC)  17.0   RBC (CBC) 3.84   Hemoglobin (CBC)  11.6   Hematocrit (CBC)  34.2   Platelet Count (CBC)  76   MCV 89   MCH 30.2   MCHC 34.0   RDW 14.1   Neutrophil % 80.4   Lymphocyte % 7.8   Monocyte % 11.7   Eosinophil % 0.0   Basophil % 0.1   Neutrophil #  13.7   Lymphocyte # 1.3   Monocyte #  2.0   Eosinophil # 0.0   Basophil # 0.0 (Result(s) reported on 01 Oct 2011 at 06:50AM.)   EKG:   Interpretation EKG dated 09/30/2011 shows normal sinus rhythm with T-wave abnormality in V1 through V4    Codeine: Resp. Distress  Novocain: Resp. Distress  "some anesthesia drug" caused altered mental states and caused agoraphobia: Alt Ment Status  Vital Signs/Nurse's Notes: **Vital Signs.:   21-Jun-13 11:00   Vital Signs Type Routine   Pulse Pulse 86   Respirations Respirations 24   Systolic BP Systolic BP 875   Diastolic BP (mmHg) Diastolic BP (mmHg) 47   Mean BP 74   Pulse Ox % Pulse Ox % 97   Pulse Ox Activity Level  At rest   Oxygen Delivery 2L; Nasal Cannula   Pulse Ox Heart Rate 86     Impression Carla Mullins is a very pleasant 81 year old woman with a history of coronary artery disease, bypass surgery in 2005, moderate aortic valve regurgitation, mild to moderate MR, hyperlipidemia, hypertension, long smoking history for at least 40 years, lower extremity peripheral vascular disease,  stopped smoking in 2005, status post bilateral iliac stenting of internal and external iliac vessels, postoperative blood loss, hypotension requiring blood transfusion. Cardiology was consulted for elevated cardiac enzymes.  1) elevated cardiac enzymes Severe underlying  coronary artery disease with atretic LIMA and occluded vein graft previously  noted in 2012 with severe left main disease and three-vessel coronary artery disease. -I suspect the period of hypotension and profound anemia is likely the cause of her elevated cardiac enzymes. Unable to exclude transfusion reaction as she reports having nausea vomiting while transfusion was occurring. -Do not think this is a primary ischemic event  -Anemia has now resolved, malaise and nausea vomiting resolved. -Weighted recommend continuing cardiac enzymes as you are doing with no aggressive antiplatelet therapy given recent groin hematoma, especially as she is pain-free. -Would restart outpatient medications possibly tomorrow will per Dr.Schneir. -No additional workup needed at this time. Recent catheterization last year with medical management recommended given the severity of her disease.  2) PCI of external and internal iliac vessels, kissing balloon technique to the distal aorta bifurcation Successful PCI per the notes. Managed by Dr. Ronalee Belts  3) renal insufficiency Worsening renal function through her short admission possibly from contrast nephropathy, Unable to exclude ATN from period of hypotension and anemia I suspect this will slowly peak and then improve now that anemia and blood pressure stable. She is currently on low-dose IV fluids as she is not tolerating much by mouth this a.m.    Plan 4) COPD Appears stable Would continue outpatient inhalers   Electronic Signatures: Ida Rogue (MD)  (Signed 21-Jun-13 12:39)  Authored: General Aspect/Present Illness, History and Physical Exam, Review of System, Past Medical History, Health Issues, Home Medications, Labs, EKG , Allergies, Vital Signs/Nurse's Notes, Impression/Plan   Last Updated: 21-Jun-13 12:39 by Ida Rogue (MD)

## 2014-08-04 NOTE — Op Note (Signed)
PATIENT NAME:  Carla Mullins, Carla Mullins MR#:  [id number] DATE OF BIRTH:  10-Feb-1927  DATE OF PROCEDURE:  09/29/2011  PREOPERATIVE DIAGNOSIS: Atherosclerotic occlusive disease of bilateral lower extremities with rest pain symptoms and claudication.   POSTOPERATIVE DIAGNOSIS:  Atherosclerotic occlusive disease of bilateral lower extremities with rest pain symptoms and claudication.   PROCEDURES PERFORMED: 1. Abdominal aortogram.  2. Introduction catheter, first order catheter placement.  3. Introduction catheter, second order catheter placement.  4. Percutaneous transluminal angioplasty with stent placement, right common iliac artery.  5. Percutaneous transluminal angioplasty with stent placement, left common iliac artery.  6. Percutaneous transluminal angioplasty with stent placement, left external iliac artery.  7. Percutaneous transluminal angioplasty alone of right external iliac artery.   SURGEON: Levora Dredge, M.D.   SEDATION: Versed 4 mg plus fentanyl 200 mcg administered IV. Continuous ECG, pulse oximetry and cardiopulmonary monitoring was performed throughout the entire procedure by the interventional radiology nurse. Total sedation time was 2 hours, 50 minutes.   ACCESSES:  1. 6 French sheath, right common femoral artery.  2. 6 French sheath, left common femoral artery.  3. 6 French sheath, left brachial artery.   CONTRAST USED: Isovue 175 mL.   FLUOROSCOPY TIME: 47.2 minutes.   INDICATIONS: Carla Mullins is an 81 year old woman who presented with increasing leg pain and lifestyle limitations. She is undergoing angiography with the hope for intervention with utilization of minimally invasive techniques. The risks and benefits were reviewed, all questions answered, and the patient agrees to proceed.   DESCRIPTION OF PROCEDURE: The patient is taken to special procedures and placed in the supine position. After adequate sedation is achieved, both groins are prepped and draped in a sterile  fashion. Ultrasound is placed in a sterile sleeve. Secondary to poor femoral impulses, the left common femoral artery is identified. It is echolucent and pulsatile indicating patency. Image is recorded. Under direct ultrasound visualization, a micropuncture needle is inserted, microwire followed by microsheath, and J-wire followed by 5 French sheath is inserted. High-grade stenosis is identified on hand injection and a wire is advanced, 4000 units of heparin is given, and a 4 x 4 balloon is used to angioplasty the external iliac artery. The sheath is then advanced with a wire and Advantage wire and catheter are negotiated into the aorta. Aortogram with bilateral lower extremity runoff is then obtained.   Under ultrasound guidance, the right common femoral artery is identified. It is echolucent and minimally pulsatile indicating patency. It is accessed after image is recorded under real-time visualization. Advantage wire and catheters are now used. After multiple attempts a large dissection is created throughout the distal aorta. At one point, trying to cross from the left side to the right side, left side control is now lost and a large dissection has propagated into the left side rendering achieving reentry into the aorta impossible. Given the circumstances after multiple, multiple tries obviously the fluoroscopy time is noted as well. Therefore, it is elected to advance the right wire into the left external iliac. This is confirmed with catheter puffing contrast and it is intraluminal. Sheath also on the left is confirmed to be intraluminal. Therefore, the brachial artery is accessed after the left arm is prepped and draped. Catheter is passed into the aorta, bolus injection of contrast is used to verify intraluminal placement, and a cloverleaf snare is advanced down into the left over a Glidewire. Confirmation of intraluminal placement is made with hand injection through the catheter of the snare. The snare is  then advanced and both wire from the right side, which is across the aortic bifurcation into the left, as well as wire from the left sheath, are snared and pulled into the aorta. Kumpe is then advanced over each wire, hand injection of contrast is used to verify intraluminal placement, and another 2000 units of heparin is given considering the time that has gone, and after verifying intraluminal placement Advantage wires are advanced up both sides. 6 x 39 Herculink stents are then deployed in kissing fashion, on the right and left simultaneously. Follow-up angiography demonstrates significant dissection into the common iliac on the right and a 10/8 is deployed on the right beginning in the Herculink stent and extending it down to the distal common. On the left, a 10/6 is deployed. These are post dilated with 7 balloons. Follow-up demonstrates there is a significant stenosis on the right side and this is treated with a 5 x 4 balloon, in the external iliac artery, with stand-alone angioplasty. On the left side, there is persistent dissection and a Life Star 8 x 60 stent is deployed across this lesion and then post dilated with a 5 balloon. Follow-up angiography demonstrates that the entire system is now patent and fills the common femoral arteries. A small amount of extravasation is noted on the right side and this is felt to be due to the manipulations from the sheath and poor seal around the sheath. Mynx device is then deployed in this area and pressure is held. On the right side, attempts at Mynx device are not successful and a significant hematoma is formed and pressure is held. The sheath on the brachial artery is removed and pressure is held. At this point in the procedure, the patient did drop her heart rate, she became nauseated, and she also dropped her pressure. Therefore, manual pressure was now held at all three sites for an extra 30 minutes. She was resuscitated with a liter of saline and maintained blood  pressures in excess of 130 systolic. One dose of atropine was given and her heart rate was now in the 70s and 80s. She was mentating throughout the time and she maintained oxygen saturation at 100. Nevertheless, given the drop in blood pressure and the extensiveness of the procedure and the length of time, it was elected to observe her overnight. She is transferred after being recovered in the holding area to the Intensive Care Unit for observation overnight.   INTERPRETATION: Initial views demonstrate there is distal occlusion of the aorta with extensive disease throughout the iliac systems. Following angioplasty and stenting as described above, there is successful recanalization.   SUMMARY: Successful angioplasty and stenting to recanalize both right and left iliac systems using the kissing balloon technique and raising the aortic bifurcation, as described above.  ____________________________ Renford DillsGregory G. Krysteena Stalker, MD ggs:slb D: 09/29/2011 18:26:54 ET T: 09/30/2011 08:35:28 ET JOB#: [id number]  cc: Renford DillsGregory G. Jesusita Jocelyn, MD, <Dictator> Renford DillsGREGORY G Jaelynne Hockley MD ELECTRONICALLY SIGNED 10/07/2011 10:03
